# Patient Record
Sex: Male | Born: 1995 | Race: Black or African American | Hispanic: No | Marital: Single | Smoking: Never smoker
Health system: Southern US, Community
[De-identification: ages and names within clinical notes are randomized; demographics above are authoritative.]

---
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
## 2014-08-30 ENCOUNTER — Encounter (HOSPITAL_COMMUNITY): Payer: Self-pay | Admitting: Emergency Medicine

## 2014-08-30 DIAGNOSIS — X58XXXA Exposure to other specified factors, initial encounter: Secondary | ICD-10-CM | POA: Insufficient documentation

## 2014-08-30 DIAGNOSIS — Y92310 Basketball court as the place of occurrence of the external cause: Secondary | ICD-10-CM | POA: Diagnosis not present

## 2014-08-30 DIAGNOSIS — S6991XA Unspecified injury of right wrist, hand and finger(s), initial encounter: Secondary | ICD-10-CM | POA: Insufficient documentation

## 2014-08-30 DIAGNOSIS — Y998 Other external cause status: Secondary | ICD-10-CM | POA: Diagnosis not present

## 2014-08-30 DIAGNOSIS — Y9367 Activity, basketball: Secondary | ICD-10-CM | POA: Diagnosis not present

## 2014-08-30 NOTE — ED Notes (Signed)
Pt. injured his right wrist while 3 days ago while playing basketball , reports pain with mild swelling .

## 2014-08-31 ENCOUNTER — Emergency Department (HOSPITAL_COMMUNITY): Payer: Medicaid Other

## 2014-08-31 ENCOUNTER — Emergency Department (HOSPITAL_COMMUNITY)
Admission: EM | Admit: 2014-08-31 | Discharge: 2014-08-31 | Disposition: A | Discharge status: Home / Self Care | Payer: Medicaid Other | Attending: Emergency Medicine | Admitting: Emergency Medicine

## 2014-08-31 DIAGNOSIS — S6991XA Unspecified injury of right wrist, hand and finger(s), initial encounter: Secondary | ICD-10-CM

## 2014-08-31 MED ORDER — KETOROLAC TROMETHAMINE 60 MG/2ML IM SOLN
60.0000 mg | Freq: Once | INTRAMUSCULAR | Status: AC
Start: 2014-08-31 — End: 2014-08-31
  Administered 2014-08-31: 60 mg via INTRAMUSCULAR
  Filled 2014-08-31: qty 2

## 2014-08-31 NOTE — Discharge Instructions (Signed)
Return to the emergency room with worsening of symptoms, new symptoms or with symptoms that are concerning, especially fevers, redness, swelling, numbness, tingling, unable to move fingers. RICE: Rest, Ice (three cycles of 20 mins on, off at least twice a day), compression/brace, elevation. Heating pad works well for back pain. Ibuprofen 400mg  (2 tablets 200mg ) every 5-6 hours for 3-5 days. Follow up with PCP/orthopedist if symptoms worsen or are persistent. Read below information and follow recommendations. Carpal Tunnel Syndrome Carpal tunnel syndrome is a disorder of the nervous system in the wrist that causes pain, hand weakness, and/or loss of feeling. Carpal tunnel syndrome is caused by the compression, stretching, or irritation of the median nerve at the wrist joint. Athletes who experience carpal tunnel syndrome may notice a decrease in their performance to the condition, especially for sports that require strong hand or wrist action.  SYMPTOMS   Tingling, numbness, or burning pain in the hand or fingers.  Inability to sleep due to pain in the hand.  Sharp pains that shoot from the wrist up the arm or to the fingers, especially at night.  Morning stiffness or cramping of the hand.  Thumb weakness, resulting in difficulty holding objects or making a fist.  Shiny, dry skin on the hand.  Reduced performance in any sport requiring a strong grip. CAUSES   Median nerve damage at the wrist is caused by pressure due to swelling, inflammation, or scarred tissue.  Sources of pressure include:  Repetitive gripping or squeezing that causes inflammation of the tendon sheaths.  Scarring or shortening of the ligament that covers the median nerve.  Traumatic injury to the wrist or forearm such as fracture, sprain, or dislocation.  Prolonged hyperextension (wrist bent backward) or hyperflexion (wrist bent downward) of the wrist. RISK INCREASES WITH:  Diabetes mellitus.  Menopause  or amenorrhea.  Rheumatoid arthritis.  Raynaud disease.  Pregnancy.  Gout.  Kidney disease.  Ganglion cyst.  Repetitive hand or wrist action.  Hypothyroidism (underactive thyroid gland).  Repetitive jolting or shaking of the hands or wrist.  Prolonged forceful weight-bearing on the hands. PREVENTION  Bracing the hand and wrist straight during activities that involve repetitive grasping.  For activities that require prolonged extension of the wrist (bending towards the top of the forearm) periodically change the position of your wrists.  Learn and use proper technique in activities that result in the wrist position in neutral to slight extension.  Avoid bending the wrist into full extension or flexion (up or down).  Keep the wrist in a straight (neutral) position. To keep the wrist in this position, wear a splint.  Avoid repetitive hand and wrist motions.  When possible avoid prolonged grasping of items (steering wheel of a car, a pen, a vacuum cleaner, or a rake).  Loosen your grip for activities that require prolonged grasping of items.  Place keyboards and writing surfaces at the correct height as to decrease strain on the wrist and hand.  Alternate work tasks to avoid prolonged wrist flexion.  Avoid pinching activities (needlework and writing) as they may irritate your carpal tunnel syndrome.  If these activities are necessary, complete them for shorter periods of time.  When writing, use a felt tip or rollerball pen and/or build up the grip on a pen to decrease the forces required for writing. PROGNOSIS  Carpal tunnel syndrome is usually curable with appropriate conservative treatment and sometimes resolves spontaneously. For some cases, surgery is necessary, especially if muscle wasting or nerve changes have  developed.  RELATED COMPLICATIONS   Permanent numbness and a weak thumb or fingers in the affected hand.  Permanent paralysis of a portion of the hand  and fingers. TREATMENT  Treatment initially consists of stopping activities that aggravate the symptoms as well as medication and ice to reduce inflammation. A wrist splint is often recommended for wear during activities of repetitive motion as well as at night. It is also important to learn and use proper technique when performing activities that typically cause pain. On occasion, a corticosteroid injection may be given. If symptoms persist despite conservative treatment, surgery may be an option. Surgical techniques free the pinched or compressed nerve. Carpal tunnel surgery is usually performed on an outpatient basis, meaning you go home the same day as surgery. These procedures provide almost complete relief of all symptoms in 95% of patients. Expect at least 2 weeks for healing after surgery. For cases that are the result of repeated jolting or shaking of the hand or wrist or prolonged hyperextension, surgery is not usually recommended because stretching of the median nerve, not compression, is usually the cause of carpal tunnel syndrome in these cases. MEDICATION   If pain medication is necessary, nonsteroidal anti-inflammatory medications, such as aspirin and ibuprofen, or other minor pain relievers, such as acetaminophen, are often recommended.  Do not take pain medication for 7 days before surgery.  Prescription pain relievers are usually only prescribed after surgery. Use only as directed and only as much as you need.  Corticosteroid injections may be given to reduce inflammation. However, they are not always recommended.  Vitamin B6 (pyridoxine) may reduce symptoms; use only if prescribed for your disorder. SEEK MEDICAL CARE IF:   Symptoms get worse or do not improve in 2 weeks despite treatment.  You also have a current or recent history of neck or shoulder injury that has resulted in pain or tingling elsewhere in your arm. Document Released: 02/28/2005 Document Revised: 07/15/2013  Document Reviewed: 06/12/2008 [facility] Patient Information 2015 [place], [place]. This information is not intended to replace advice given to you by your health care provider. Make sure you discuss any questions you have with your health care provider.

## 2014-08-31 NOTE — ED Provider Notes (Signed)
CSN: [id number]     Arrival date & time 08/30/14  2225 History   First MD Initiated Contact with Patient 08/31/14 0111     Chief Complaint  Patient presents with  . Wrist Pain     (Consider location/radiation/quality/duration/timing/severity/associated sxs/prior Treatment) HPI  Emersen T Furukawa is a 19 y.o. male presenting with right wrist injury 3 days ago while playing basketball. Patient reports pain to right wrist worse with movement especially repetitive movements and swelling. Patient has not taken anything for his symptoms. He denies alleviating factors. He denies redness, numbness, tingling, fevers, chills. Never had pain like this before.   History reviewed. No pertinent past medical history. History reviewed. No pertinent past surgical history. No family history on file. History  Substance Use Topics  . Smoking status: Never Smoker   . Smokeless tobacco: Not on file  . Alcohol Use: No    Review of Systems  Musculoskeletal: Positive for myalgias and arthralgias.  Skin: Negative for color change and wound.  Neurological: Negative for weakness and numbness.      Allergies  Review of patient's allergies indicates no known allergies.  Home Medications   Prior to Admission medications   Not on File   BP 128/90 mmHg  Pulse 97  Temp(Src) 99.1 F (37.3 C) (Oral)  Resp 16  Ht 5\' 11"  (1.803 m)  Wt 248 lb (112.492 kg)  BMI 34.60 kg/m2  SpO2 100% Physical Exam  Constitutional: He appears well-developed and well-nourished. No distress.  HENT:  Head: Normocephalic and atraumatic.  Eyes: Conjunctivae are normal. Right eye exhibits no discharge. Left eye exhibits no discharge.  Cardiovascular:  2+ radial pulses equal bilaterally.  Pulmonary/Chest: Effort normal. No respiratory distress.  Musculoskeletal:  Full range of motion of right wrist without edema, erythema, wound. Patient with tenderness to Palmer mid wrist on right.  Neurological: He is alert.  Coordination normal.  5/5 strength in bilateral upper extremities. Sensation intact.  Skin: He is not diaphoretic.  Psychiatric: He has a normal mood and affect. His behavior is normal.  Nursing note and vitals reviewed.   ED Course  Procedures (including critical care time) Labs Review Labs Reviewed - No data to display  Imaging Review Dg Wrist Complete Right  08/31/2014   CLINICAL DATA:  19 year old male with wrist injury 3 days ago playing basketball. Pain. Initial encounter.  EXAM: RIGHT WRIST - COMPLETE 3+ VIEW  COMPARISON:  None.  FINDINGS: Bone mineralization is within normal limits. Distal radius and ulna intact. Carpal bone alignment preserved. Scaphoid intact. Visible metacarpals intact. No acute osseous abnormality identified.  IMPRESSION: No acute fracture or dislocation identified about the right wrist.   Electronically Signed   By: Odessa Fleming M.D.   On: 08/31/2014 00:26     EKG Interpretation None      MDM   Final diagnoses:  Right wrist injury, initial encounter   Patient presenting with right wrist injury. Neurovascularly intact. X-ray without acute abnormalities. Full range of motion without warmth, erythema, swelling to joint. I doubt septic arthritis. Patient likely with element of medial nerve irritation. Patient being given a brace in the ED and discussed rice protocol as well as ibuprofen use. Follow-up with orthopedics for persistent or worsening pain.  Discussed return precautions with patient. Discussed all results and patient verbalizes understanding and agrees with plan.      Oswaldo Conroy, PA-C 08/31/14 0128  Blake Divine, MD 09/01/14 [phone]

## 2014-09-01 ENCOUNTER — Encounter (HOSPITAL_COMMUNITY): Payer: Self-pay | Admitting: Emergency Medicine

## 2014-09-01 ENCOUNTER — Emergency Department (INDEPENDENT_AMBULATORY_CARE_PROVIDER_SITE_OTHER)
Admission: EM | Admit: 2014-09-01 | Discharge: 2014-09-01 | Disposition: A | Discharge status: Home / Self Care | Payer: Medicaid Other | Source: Home / Self Care | Attending: Emergency Medicine | Admitting: Emergency Medicine

## 2014-09-01 DIAGNOSIS — S39012A Strain of muscle, fascia and tendon of lower back, initial encounter: Secondary | ICD-10-CM

## 2014-09-01 DIAGNOSIS — M549 Dorsalgia, unspecified: Secondary | ICD-10-CM | POA: Diagnosis not present

## 2014-09-01 DIAGNOSIS — J029 Acute pharyngitis, unspecified: Secondary | ICD-10-CM

## 2014-09-01 DIAGNOSIS — M791 Myalgia, unspecified site: Secondary | ICD-10-CM

## 2014-09-01 LAB — POCT RAPID STREP A: STREPTOCOCCUS, GROUP A SCREEN (DIRECT): NEGATIVE

## 2014-09-01 MED ORDER — KETOROLAC TROMETHAMINE 60 MG/2ML IM SOLN
60.0000 mg | Freq: Once | INTRAMUSCULAR | Status: AC
  Administered 2014-09-01: 60 mg via INTRAMUSCULAR

## 2014-09-01 MED ORDER — KETOROLAC TROMETHAMINE 10 MG PO TABS: ORAL_TABLET | ORAL | Status: DC

## 2014-09-01 MED ORDER — AMOXICILLIN 500 MG PO CAPS: 1000.0000 mg | ORAL_CAPSULE | Freq: Two times a day (BID) | ORAL | Status: DC

## 2014-09-01 MED ORDER — KETOROLAC TROMETHAMINE 60 MG/2ML IM SOLN
INTRAMUSCULAR | Status: AC
  Filled 2014-09-01: qty 2

## 2014-09-01 NOTE — Discharge Instructions (Signed)
Back Pain, Adult Low back pain is very common. About 1 in 5 people have back pain.The cause of low back pain is rarely dangerous. The pain often gets better over time.About half of people with a sudden onset of back pain feel better in just 2 weeks. About 8 in 10 people feel better by 6 weeks.  CAUSES Some common causes of back pain include:  Strain of the muscles or ligaments supporting the spine.  Wear and tear (degeneration) of the spinal discs.  Arthritis.  Direct injury to the back. DIAGNOSIS Most of the time, the direct cause of low back pain is not known.However, back pain can be treated effectively even when the exact cause of the pain is unknown.Answering your caregiver's questions about your overall health and symptoms is one of the most accurate ways to make sure the cause of your pain is not dangerous. If your caregiver needs more information, he or she may order lab work or imaging tests (X-rays or MRIs).However, even if imaging tests show changes in your back, this usually does not require surgery. HOME CARE INSTRUCTIONS For many people, back pain returns.Since low back pain is rarely dangerous, it is often a condition that people can learn to [facility] their own.   Remain active. It is stressful on the back to sit or stand in one place. Do not sit, drive, or stand in one place for more than 30 minutes at a time. Take short walks on level surfaces as soon as pain allows.Try to increase the length of time you walk each day.  Do not stay in bed.Resting more than 1 or 2 days can delay your recovery.  Do not avoid exercise or work.Your body is made to move.It is not dangerous to be active, even though your back may hurt.Your back will likely heal faster if you return to being active before your pain is gone.  Pay attention to your body when you bend and lift. Many people have less discomfortwhen lifting if they bend their knees, keep the load close to their bodies,and  avoid twisting. Often, the most comfortable positions are those that put less stress on your recovering back.  Find a comfortable position to sleep. Use a firm mattress and lie on your side with your knees slightly bent. If you lie on your back, put a pillow under your knees.  Only take over-the-counter or prescription medicines as directed by your caregiver. Over-the-counter medicines to reduce pain and inflammation are often the most helpful.Your caregiver may prescribe muscle relaxant drugs.These medicines help dull your pain so you can more quickly return to your normal activities and healthy exercise.  Put ice on the injured area.  Put ice in a plastic bag.  Place a towel between your skin and the bag.  Leave the ice on for 15-20 minutes, 03-04 times a day for the first 2 to 3 days. After that, ice and heat may be alternated to reduce pain and spasms.  Ask your caregiver about trying back exercises and gentle massage. This may be of some benefit.  Avoid feeling anxious or stressed.Stress increases muscle tension and can worsen back pain.It is important to recognize when you are anxious or stressed and learn ways to manage it.Exercise is a great option. SEEK MEDICAL CARE IF:  You have pain that is not relieved with rest or medicine.  You have pain that does not improve in 1 week.  You have new symptoms.  You are generally not feeling well. SEEK  IMMEDIATE MEDICAL CARE IF:   You have pain that radiates from your back into your legs.  You develop new bowel or bladder control problems.  You have unusual weakness or numbness in your arms or legs.  You develop nausea or vomiting.  You develop abdominal pain.  You feel faint. Document Released: 02/28/2005 Document Revised: 08/30/2011 Document Reviewed: 07/02/2013 [facility] Patient Information 2015 [place], [place]. This information is not intended to replace advice given to you by your health care provider. Make sure you  discuss any questions you have with your health care provider.  Muscle Strain A muscle strain is an injury that occurs when a muscle is stretched beyond its normal length. Usually a small number of muscle fibers are torn when this happens. Muscle strain is rated in degrees. First-degree strains have the least amount of muscle fiber tearing and pain. Second-degree and third-degree strains have increasingly more tearing and pain.  Usually, recovery from muscle strain takes 1-2 weeks. Complete healing takes 5-6 weeks.  CAUSES  Muscle strain happens when a sudden, violent force placed on a muscle stretches it too far. This may occur with lifting, sports, or a fall.  RISK FACTORS Muscle strain is especially common in athletes.  SIGNS AND SYMPTOMS At the site of the muscle strain, there may be:  Pain.  Bruising.  Swelling.  Difficulty using the muscle due to pain or lack of normal function. DIAGNOSIS  Your health care provider will perform a physical exam and ask about your medical history. TREATMENT  Often, the best treatment for a muscle strain is resting, icing, and applying cold compresses to the injured area.  HOME CARE INSTRUCTIONS   Use the PRICE method of treatment to promote muscle healing during the first 2-3 days after your injury. The PRICE method involves:  Protecting the muscle from being injured again.  Restricting your activity and resting the injured body part.  Icing your injury. To do this, put ice in a plastic bag. Place a towel between your skin and the bag. Then, apply the ice and leave it on from 15-20 minutes each hour. After the third day, switch to moist heat packs.  Apply compression to the injured area with a splint or elastic bandage. Be careful not to wrap it too tightly. This may interfere with blood circulation or increase swelling.  Elevate the injured body part above the level of your heart as often as you can.  Only take over-the-counter or  prescription medicines for pain, discomfort, or fever as directed by your health care provider.  Warming up prior to exercise helps to prevent future muscle strains. SEEK MEDICAL CARE IF:   You have increasing pain or swelling in the injured area.  You have numbness, tingling, or a significant loss of strength in the injured area. MAKE SURE YOU:   Understand these instructions.  Will watch your condition.  Will get help right away if you are not doing well or get worse. Document Released: 02/28/2005 Document Revised: 12/19/2012 Document Reviewed: 09/27/2012 [facility] Patient Information 2015 [place], [place]. This information is not intended to replace advice given to you by your health care provider. Make sure you discuss any questions you have with your health care provider.  Pharyngitis Pharyngitis is a sore throat (pharynx). There is redness, pain, and swelling of your throat. HOME CARE   Drink enough fluids to keep your pee (urine) clear or pale yellow.  Only take medicine as told by your doctor.  You may get sick  again if you do not take medicine as told. Finish your medicines, even if you start to feel better.  Do not take aspirin.  Rest.  Rinse your mouth (gargle) with salt water ( tsp of salt per 1 qt of water) every 1-2 hours. This will help the pain.  If you are not at risk for choking, you can suck on hard candy or sore throat lozenges. GET HELP IF:  You have large, tender lumps on your neck.  You have a rash.  You cough up green, yellow-brown, or bloody spit. GET HELP RIGHT AWAY IF:   You have a stiff neck.  You drool or cannot swallow liquids.  You throw up (vomit) or are not able to keep medicine or liquids down.  You have very bad pain that does not go away with medicine.  You have problems breathing (not from a stuffy nose). MAKE SURE YOU:   Understand these instructions.  Will watch your condition.  Will get help right away if you are not  doing well or get worse. Document Released: 08/17/2007 Document Revised: 12/19/2012 Document Reviewed: 11/05/2012 [facility] Patient Information 2015 [place], [place]. This information is not intended to replace advice given to you by your health care provider. Make sure you discuss any questions you have with your health care provider.  Sore Throat A sore throat is a painful, burning, sore, or scratchy feeling of the throat. There may be pain or tenderness when swallowing or talking. You may have other symptoms with a sore throat. These include coughing, sneezing, fever, or a swollen neck. A sore throat is often the first sign of another sickness. These sicknesses may include a cold, flu, strep throat, or an infection called mono. Most sore throats go away without medical treatment.  HOME CARE   Only take medicine as told by your doctor.  Drink enough fluids to keep your pee (urine) clear or pale yellow.  Rest as needed.  Try using throat sprays, lozenges, or suck on hard candy (if older than 4 years or as told).  Sip warm liquids, such as broth, herbal tea, or warm water with honey. Try sucking on frozen ice pops or drinking cold liquids.  Rinse the mouth (gargle) with salt water. Mix 1 teaspoon salt with 8 ounces of water.  Do not smoke. Avoid being around others when they are smoking.  Put a humidifier in your bedroom at night to moisten the air. You can also turn on a hot shower and sit in the bathroom for 5-10 minutes. Be sure the bathroom door is closed. GET HELP RIGHT AWAY IF:   You have trouble breathing.  You cannot swallow fluids, soft foods, or your spit (saliva).  You have more puffiness (swelling) in the throat.  Your sore throat does not get better in 7 days.  You feel sick to your stomach (nauseous) and throw up (vomit).  You have a fever or lasting symptoms for more than 2-3 days.  You have a fever and your symptoms suddenly get worse. MAKE SURE YOU:   Understand  these instructions.  Will watch your condition.  Will get help right away if you are not doing well or get worse. Document Released: 12/08/2007 Document Revised: 11/23/2011 Document Reviewed: 11/06/2011 [facility] Patient Information 2015 [place], [place]. This information is not intended to replace advice given to you by your health care provider. Make sure you discuss any questions you have with your health care provider.

## 2014-09-01 NOTE — ED Provider Notes (Addendum)
CSN: [id number]     Arrival date & time 09/01/14  1714 History   First MD Initiated Contact with Patient 09/01/14 1803     Chief Complaint  Patient presents with  . Back Pain  . Sore Throat   (Consider location/radiation/quality/duration/timing/severity/associated sxs/prior Treatment) HPI Comments: Approximate 3 days ago this obese 19 year old male was playing basketball. During the game he believes he injured his right wrist and experienced pain particularly with movement. He was seen in the emergency department and diagnosed with sprain. He was given a wrist splint to wear and is wearing it now. The second complaint is that of generalized back muscle pain that began the day after playing basketball. Source with movement bending twisting. Third complaint is that of a sore throat that began 2 days ago.  Patient is a 19 y.o. male presenting with back pain and pharyngitis.  Back Pain Associated symptoms: no fever   Sore Throat    History reviewed. No pertinent past medical history. History reviewed. No pertinent past surgical history. No family history on file. History  Substance Use Topics  . Smoking status: Never Smoker   . Smokeless tobacco: Not on file  . Alcohol Use: No    Review of Systems  Constitutional: Positive for activity change. Negative for fever.  HENT: Positive for sore throat. Negative for congestion and ear pain.   Respiratory: Negative.   Cardiovascular: Negative.   Gastrointestinal: Negative.   Musculoskeletal: Positive for myalgias and back pain. Negative for neck pain and neck stiffness.  Skin: Negative.   Neurological: Negative.     Allergies  Review of patient's allergies indicates no known allergies.  Home Medications   Prior to Admission medications   Medication Sig Start Date End Date Taking? Authorizing Provider  amoxicillin (AMOXIL) 500 MG capsule Take 2 capsules (1,000 mg total) by mouth 2 (two) times daily. 09/01/14   Hayden Rasmussen, NP   ketorolac (TORADOL) 10 MG tablet Take 2 tablets now, then 1 q 8h prn pain 09/01/14   Hayden Rasmussen, NP   BP 119/61 mmHg  Pulse 113  Temp(Src) 100.4 F (38 C) (Oral)  Resp 16  SpO2 100% Physical Exam  Constitutional: He is oriented to person, place, and time. He appears well-developed and well-nourished. No distress.  HENT:  Bilateral TMs are normal Oropharynx beefy red with several gray exudates.  Eyes: Conjunctivae and EOM are normal.  Neck: Normal range of motion. Neck supple.  Cardiovascular: Normal rate, regular rhythm and normal heart sounds.   Pulmonary/Chest: Effort normal and breath sounds normal. No respiratory distress. He has no wheezes.  Musculoskeletal: He exhibits tenderness.  Tenderness to light palpation to the entire back musculature. Mild spinal tenderness but without deformity, step-off deformity, swelling or discoloration. Patient is able to sit up from a lying position and lie from a sitting position and leaning forward without spinal pain.  Lymphadenopathy:    He has no cervical adenopathy.  Neurological: He is alert and oriented to person, place, and time.  Skin: Skin is warm and dry. No erythema.  Psychiatric: He has a normal mood and affect.  Nursing note and vitals reviewed.   ED Course  Procedures (including critical care time) Labs Review Labs Reviewed  POCT RAPID STREP A    Imaging Review Dg Wrist Complete Right  08/31/2014   CLINICAL DATA:  19 year old male with wrist injury 3 days ago playing basketball. Pain. Initial encounter.  EXAM: RIGHT WRIST - COMPLETE 3+ VIEW  COMPARISON:  None.  FINDINGS:  Bone mineralization is within normal limits. Distal radius and ulna intact. Carpal bone alignment preserved. Scaphoid intact. Visible metacarpals intact. No acute osseous abnormality identified.  IMPRESSION: No acute fracture or dislocation identified about the right wrist.   Electronically Signed   By: Odessa Fleming M.D.   On: 08/31/2014 00:26     MDM   1.  Bilateral back pain, unspecified location   2. Myalgia   3. Back strain, initial encounter   4. Exudative pharyngitis    toradol 10 mg prn pain. Rx change to 1 now, then q 8h prn. Adm Toradol 60 mg IM. Amoxicillin as dir F/U with PCP Lots of liquids/gatorade    Hayden Rasmussen, NP 09/01/14 1824  Hayden Rasmussen, NP 09/01/14 [phone]

## 2014-09-01 NOTE — ED Notes (Signed)
C/o back pain onset Friday; believes it maybe due to playing basketball Does not recall inj/trauma Also c/o ST onset 2 days Alert, no signs of acute distress.

## 2014-09-05 LAB — CULTURE, GROUP A STREP: STREP A CULTURE: NEGATIVE

## 2014-09-05 NOTE — ED Notes (Signed)
Final report negative for strep.  

## 2017-01-18 IMAGING — CR DG WRIST COMPLETE 3+V*R*
4 series · 4 of 4 positions shown · non-contrast
Comparison: None.

CLINICAL DATA: 18-year-old male with wrist injury 3 days ago
playing basketball. Pain. Initial encounter.

EXAM:
RIGHT WRIST - COMPLETE 3+ VIEW

[wrist pa]
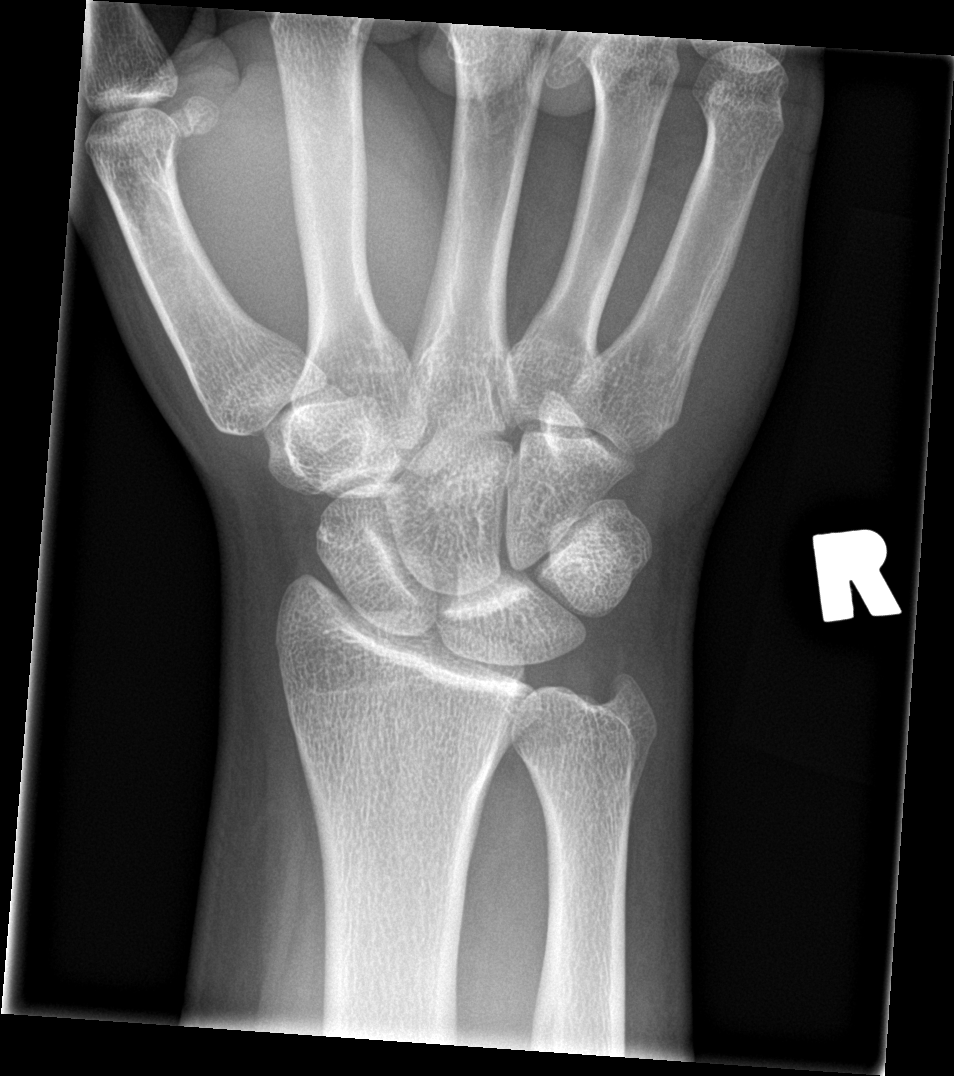

[wrist obl]
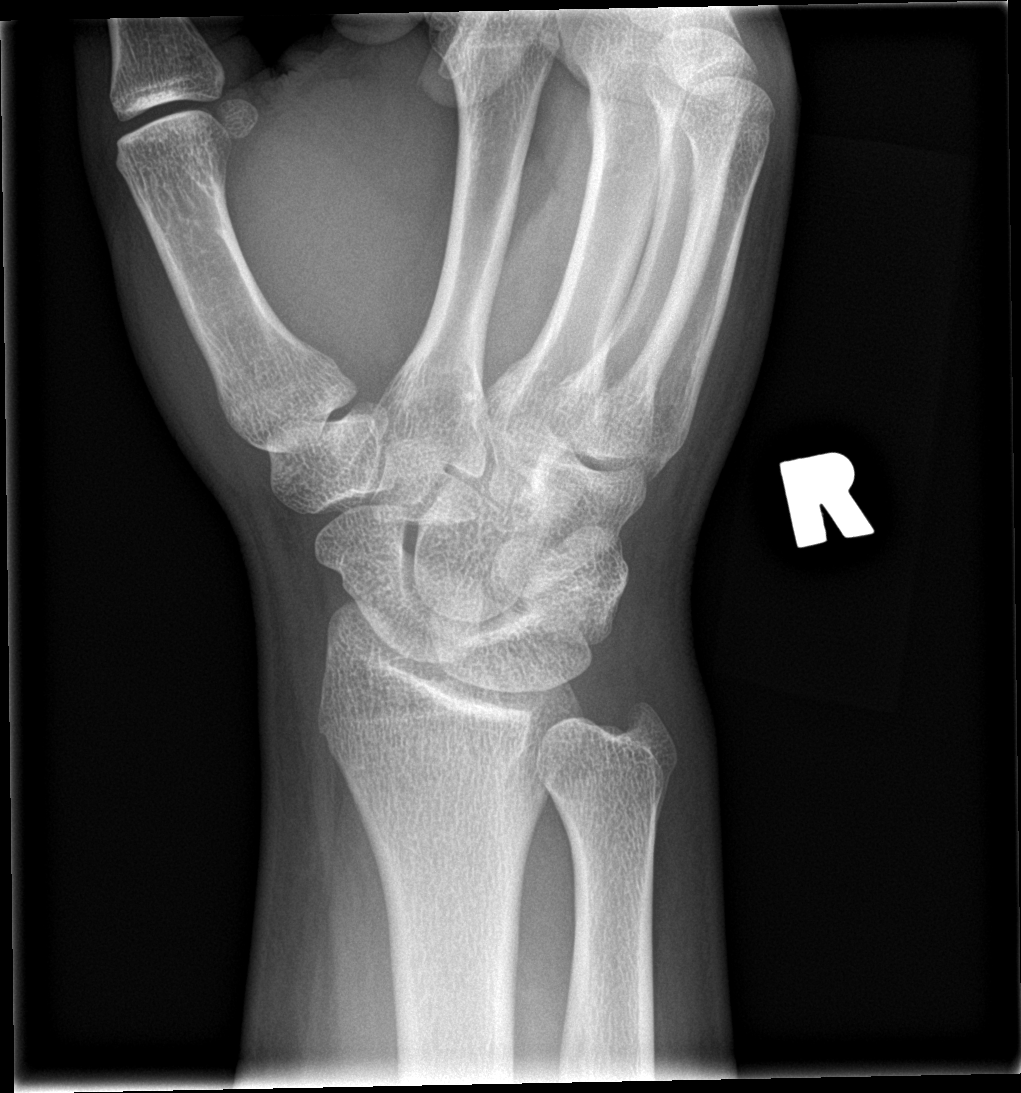

[wrist lat]
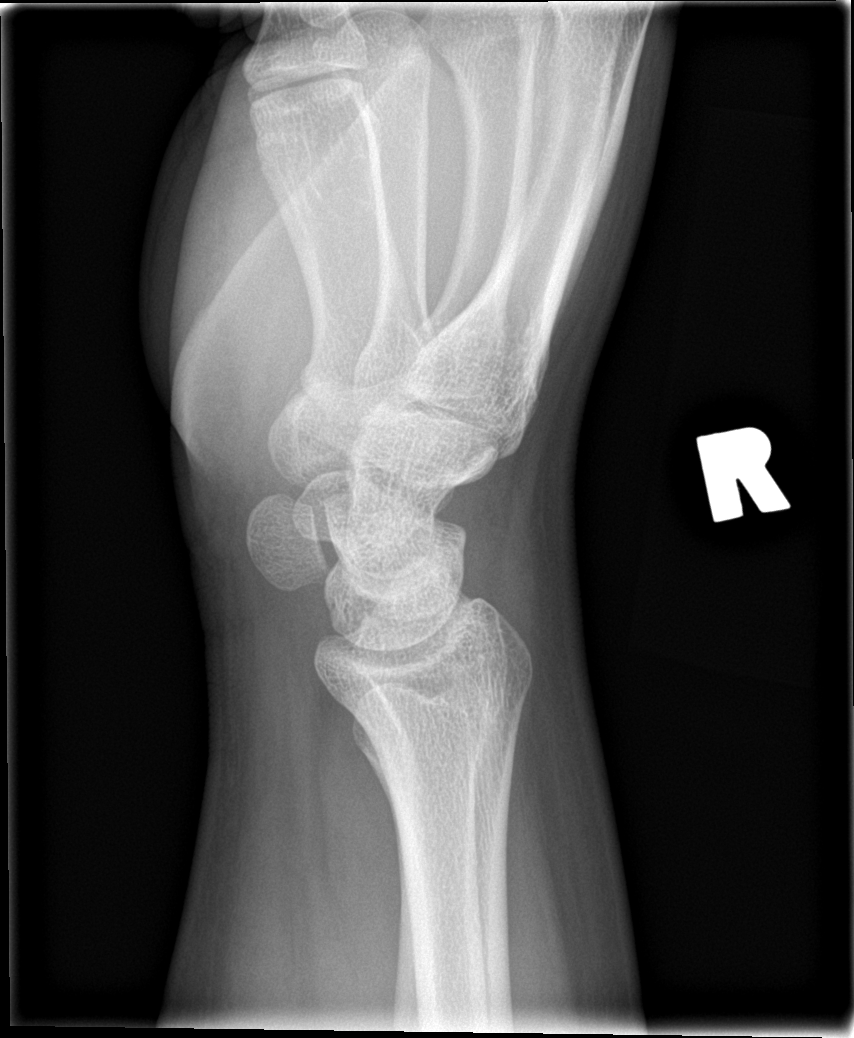

[wrist navicular]
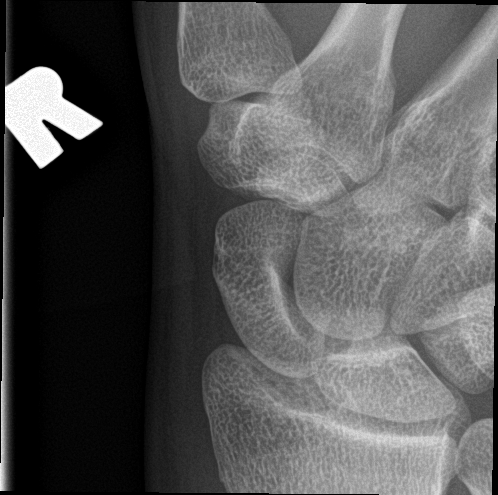

[4 of 4 positions shown; findings below may reference images not displayed]

FINDINGS: Bone mineralization is within normal limits. Distal radius and ulna
intact. Carpal bone alignment preserved. Scaphoid intact. Visible
metacarpals intact. No acute osseous abnormality identified.
IMPRESSION: No acute fracture or dislocation identified about the right wrist.

## 2018-05-18 ENCOUNTER — Ambulatory Visit (HOSPITAL_COMMUNITY)
Admission: EM | Admit: 2018-05-18 | Discharge: 2018-05-18 | Disposition: A | Discharge status: Home / Self Care | Payer: Self-pay | Attending: Family Medicine | Admitting: Family Medicine

## 2018-05-18 ENCOUNTER — Encounter (HOSPITAL_COMMUNITY): Payer: Self-pay

## 2018-05-18 DIAGNOSIS — Z202 Contact with and (suspected) exposure to infections with a predominantly sexual mode of transmission: Secondary | ICD-10-CM | POA: Insufficient documentation

## 2018-05-18 DIAGNOSIS — Z113 Encounter for screening for infections with a predominantly sexual mode of transmission: Secondary | ICD-10-CM

## 2018-05-18 LAB — POCT URINALYSIS DIP (DEVICE)
Bilirubin Urine: NEGATIVE
Glucose, UA: NEGATIVE mg/dL
Hgb urine dipstick: NEGATIVE
Ketones, ur: NEGATIVE mg/dL
Leukocytes,Ua: NEGATIVE
NITRITE: NEGATIVE
PH: 6.5 (ref 5.0–8.0)
PROTEIN: NEGATIVE mg/dL
Specific Gravity, Urine: 1.02 (ref 1.005–1.030)
UROBILINOGEN UA: 0.2 mg/dL (ref 0.0–1.0)

## 2018-05-18 MED ORDER — CEFTRIAXONE SODIUM 250 MG IJ SOLR
250.0000 mg | Freq: Once | INTRAMUSCULAR | Status: AC
  Administered 2018-05-18: 250 mg via INTRAMUSCULAR

## 2018-05-18 MED ORDER — AZITHROMYCIN 250 MG PO TABS
ORAL_TABLET | ORAL | Status: AC
  Filled 2018-05-18: qty 4

## 2018-05-18 MED ORDER — AZITHROMYCIN 250 MG PO TABS
1000.0000 mg | ORAL_TABLET | Freq: Once | ORAL | Status: AC
  Administered 2018-05-18: 1000 mg via ORAL

## 2018-05-18 MED ORDER — CEFTRIAXONE SODIUM 250 MG IJ SOLR
INTRAMUSCULAR | Status: AC
Start: 2018-05-18 — End: ?
  Filled 2018-05-18: qty 250

## 2018-05-18 NOTE — Discharge Instructions (Signed)
Given rocephin 250mg  injection and azithromycin 1g in office Urine cytology obtained  Declines HIV/ syphilis testing today We will follow up with you regarding the results of your tests If tests are positive, please abstain from sexual activity for at least 7 days and notify partners  Follow up with PCP or with [facility] if symptoms persists Return here or go to ER if you have any new or worsening symptoms fever, chills, nausea, vomiting, abdominal or pelvic pain, penile rashes or lesions, testicular swelling or pain, etc..Marland Kitchen

## 2018-05-18 NOTE — ED Provider Notes (Signed)
[facility]   [id number] 05/18/18 Arrival Time: 1215   WL:[id number] FOR STD  SUBJECTIVE:  Jandel T Meier is a 23 y.o. male who presents requesting STI screening.  Currently asymptomatic.  Partner recently diagnosed with chlamydia.  Last unprotected sexual encounter 2 days ago.  Sexually active with 3 male partners within the last 6 months.  Denies similar symptoms in the past.  Denies fever, chills, nausea, vomiting, abdominal or pelvic pain, penile rashes or lesions, testicular swelling or pain.     No LMP for male patient.  ROS: As per HPI.  History reviewed. No pertinent past medical history. History reviewed. No pertinent surgical history. No Known Allergies No current facility-administered medications on file prior to encounter.    No current outpatient medications on file prior to encounter.   Social History   Socioeconomic History  . Marital status: Single    Spouse name: Not on file  . Number of children: Not on file  . Years of education: Not on file  . Highest education level: Not on file  Occupational History  . Not on file  Social Needs  . Financial resource strain: Not on file  . Food insecurity:    Worry: Not on file    Inability: Not on file  . Transportation needs:    Medical: Not on file    Non-medical: Not on file  Tobacco Use  . Smoking status: Never Smoker  Substance and Sexual Activity  . Alcohol use: No  . Drug use: No  . Sexual activity: Not on file  Lifestyle  . Physical activity:    Days per week: Not on file    Minutes per session: Not on file  . Stress: Not on file  Relationships  . Social connections:    Talks on phone: Not on file    Gets together: Not on file    Attends religious service: Not on file    Active member of club or organization: Not on file    Attends meetings of clubs or organizations: Not on file    Relationship status: Not on file  . Intimate partner violence:    Fear of current or ex partner: Not  on file    Emotionally abused: Not on file    Physically abused: Not on file    Forced sexual activity: Not on file  Other Topics Concern  . Not on file  Social History Narrative  . Not on file   History reviewed. No pertinent family history.  OBJECTIVE:  Vitals:   05/18/18 1241  BP: 135/80  Pulse: 66  Resp: 18  Temp: 98.5 F (36.9 C)  TempSrc: Oral  SpO2: 100%     General appearance: alert, NAD, appears stated age Head: NCAT Throat: lips, mucosa, and tongue normal; teeth and gums normal Lungs: CTA bilaterally without adventitious breath sounds Heart: regular rate and rhythm.  Radial pulses 2+ symmetrical bilaterally Back: no CVA tenderness Abdomen: soft, non-tender; bowel sounds normal; no guarding GU: deferred Skin: warm and dry Psychological:  Alert and cooperative. Normal mood and affect.  LABS:  Results for orders placed or performed during the hospital encounter of 05/18/18  POCT urinalysis dip (device)  Result Value Ref Range   Glucose, UA NEGATIVE NEGATIVE mg/dL   Bilirubin Urine NEGATIVE NEGATIVE   Ketones, ur NEGATIVE NEGATIVE mg/dL   Specific Gravity, Urine 1.020 1.005 - 1.030   Hgb urine dipstick NEGATIVE NEGATIVE   pH 6.5 5.0 - 8.0   Protein, ur  NEGATIVE NEGATIVE mg/dL   Urobilinogen, UA 0.2 0.0 - 1.0 mg/dL   Nitrite NEGATIVE NEGATIVE   Leukocytes,Ua NEGATIVE NEGATIVE    Labs Reviewed  POCT URINALYSIS DIP (DEVICE)  URINE CYTOLOGY ANCILLARY ONLY    ASSESSMENT & PLAN:  1. STD exposure     Meds ordered this encounter  Medications  . cefTRIAXone (ROCEPHIN) injection 250 mg    Order Specific Question:   Antibiotic Indication:    Answer:   STD  . azithromycin (ZITHROMAX) tablet 1,000 mg    Pending: Labs Reviewed  POCT URINALYSIS DIP (DEVICE)  URINE CYTOLOGY ANCILLARY ONLY    Given rocephin 250mg  injection and azithromycin 1g in office Urine cytology obtained  Declines HIV/ syphilis testing today We will follow up with you regarding  the results of your tests If tests are positive, please abstain from sexual activity for at least 7 days and notify partners  Follow up with PCP or with Community health if symptoms persists Return here or go to ER if you have any new or worsening symptoms fever, chills, nausea, vomiting, abdominal or pelvic pain, penile rashes or lesions, testicular swelling or pain, etc...     Reviewed expectations re: course of current medical issues. Questions answered. Outlined signs and symptoms indicating need for more acute intervention. Patient verbalized understanding. After Visit Summary given.       Rennis Harding, PA-C 05/18/18 1513

## 2018-05-18 NOTE — ED Triage Notes (Signed)
Pt present that his partner has been exposed to an STD (chalymdia).  Pt denies any Symptoms.

## 2018-05-21 LAB — URINE CYTOLOGY ANCILLARY ONLY
CHLAMYDIA, DNA PROBE: POSITIVE — AB
NEISSERIA GONORRHEA: NEGATIVE
TRICH (WINDOWPATH): NEGATIVE

## 2018-05-22 ENCOUNTER — Telehealth (HOSPITAL_COMMUNITY): Payer: Self-pay | Admitting: Emergency Medicine

## 2018-05-22 NOTE — Telephone Encounter (Signed)
Chlamydia is positive.  This was treated at the urgent care visit with po zithromax 1g.  Pt needs education to please refrain from sexual intercourse for 7 days to give the medicine time to work.  Sexual partners need to be notified and tested/treated.  Condoms may reduce risk of reinfection.  Recheck or followup with PCP for further evaluation if symptoms are not improving.  [facility] notified.  Patient contacted and made aware of all results, all questions answered.   

## 2019-03-01 ENCOUNTER — Ambulatory Visit (HOSPITAL_COMMUNITY)
Admission: EM | Admit: 2019-03-01 | Discharge: 2019-03-01 | Disposition: A | Discharge status: Home / Self Care | Payer: Self-pay | Attending: Family Medicine | Admitting: Family Medicine

## 2019-03-01 ENCOUNTER — Encounter (HOSPITAL_COMMUNITY): Payer: Self-pay

## 2019-03-01 ENCOUNTER — Other Ambulatory Visit: Payer: Self-pay

## 2019-03-01 DIAGNOSIS — Z20828 Contact with and (suspected) exposure to other viral communicable diseases: Secondary | ICD-10-CM | POA: Insufficient documentation

## 2019-03-01 DIAGNOSIS — Z20822 Contact with and (suspected) exposure to covid-19: Secondary | ICD-10-CM

## 2019-03-01 NOTE — ED Triage Notes (Signed)
Pt. States his wife tested POSITIVE today. Wants COVID testing. Denies ANY symptoms.

## 2019-03-01 NOTE — Discharge Instructions (Addendum)
Self isolate until covid results are back and negative.  Will notify you by phone of any positive findings. Your negative results will be sent through your MyChart.    Use of over the counter medications for any symptoms you may develop.  You may try some vitamins to help your immune system potentially:  Vitamin C 500mg  twice a day. Zing 50mg  daily. Vitamin D 5000IU daily.

## 2019-03-01 NOTE — ED Provider Notes (Signed)
[place]    CSN: [id number] Arrival date & time: 03/01/19  1324      History   Chief Complaint Chief Complaint  Patient presents with  . COVID testing    HPI Vincent Parks is a 23 y.o. male.   Vincent Parks presents with requests for covid-19 testing. His wife found out today she tested positive. He denies any current symptoms and feels well. No acute complaints. Without contributing medical history.      ROS per HPI, negative if not otherwise mentioned.      History reviewed. No pertinent past medical history.  There are no problems to display for this patient.   History reviewed. No pertinent surgical history.     Home Medications    Prior to Admission medications   Not on File    Family History Family History  Problem Relation Age of Onset  . Healthy Mother   . Healthy Father     Social History Social History   Tobacco Use  . Smoking status: Never Smoker  . Smokeless tobacco: Never Used  Substance Use Topics  . Alcohol use: No  . Drug use: No     Allergies   Patient has no known allergies.   Review of Systems Review of Systems   Physical Exam Triage Vital Signs ED Triage Vitals  Enc Vitals Group     BP 03/01/19 1345 122/81     Pulse Rate 03/01/19 1345 (!) 111     Resp 03/01/19 1345 19     Temp 03/01/19 1345 98.9 F (37.2 C)     Temp Source 03/01/19 1345 Oral     SpO2 03/01/19 1345 96 %     Weight 03/01/19 1344 282 lb (127.9 kg)     Height --      Head Circumference --      Peak Flow --      Pain Score 03/01/19 1344 0     Pain Loc --      Pain Edu? --      Excl. in [place]? --    No data found.  Updated Vital Signs BP 122/81 (BP Location: Right Arm)   Pulse (!) 111   Temp 98.9 F (37.2 C) (Oral)   Resp 19   Wt 282 lb (127.9 kg)   SpO2 96%   BMI 39.33 kg/m    Physical Exam Constitutional:      Appearance: He is well-developed.  Cardiovascular:     Rate and Rhythm: Tachycardia present.   Pulmonary:     Effort: Pulmonary effort is normal.  Skin:    General: Skin is warm and dry.  Neurological:     Mental Status: He is alert and oriented to person, place, and time.      UC Treatments / Results  Labs (all labs ordered are listed, but only abnormal results are displayed) Labs Reviewed  NOVEL CORONAVIRUS, NAA (HOSP ORDER, SEND-OUT TO REF LAB; TAT 18-24 HRS)    EKG   Radiology No results found.  Procedures Procedures (including critical care time)  Medications Ordered in UC Medications - No data to display  Initial Impression / Assessment and Plan / UC Course  I have reviewed the triage vital signs and the nursing notes.  Pertinent labs & imaging results that were available during my care of the patient were reviewed by me and considered in my medical decision making (see chart for details).     No acute complaints. Noted mild  tachycardia. covid testing collected and pending. Will notify of any positive findings and if any changes to treatment are needed.  Isolation recommended. Patient verbalized understanding and agreeable to plan.   Final Clinical Impressions(s) / UC Diagnoses   Final diagnoses:  Close exposure to COVID-19 virus  Encounter for laboratory testing for COVID-19 virus     Discharge Instructions     Self isolate until covid results are back and negative.  Will notify you by phone of any positive findings. Your negative results will be sent through your MyChart.    Use of over the counter medications for any symptoms you may develop.  You may try some vitamins to help your immune system potentially:  Vitamin C 500mg  twice a day. Zing 50mg  daily. Vitamin D 5000IU daily.      ED Prescriptions    None     PDMP not reviewed this encounter.   , NP 03/01/19 1441

## 2019-03-04 LAB — NOVEL CORONAVIRUS, NAA (HOSP ORDER, SEND-OUT TO REF LAB; TAT 18-24 HRS): SARS-CoV-2, NAA: NOT DETECTED

## 2024-03-29 ENCOUNTER — Telehealth
